# Patient Record
Sex: Male | Born: 1995 | Race: White | Hispanic: No | Marital: Single | Smoking: Never smoker
Health system: Southern US, Community
[De-identification: ages and names within clinical notes are randomized; demographics above are authoritative.]

---

## 2015-11-23 ENCOUNTER — Emergency Department
Admission: EM | Admit: 2015-11-23 | Discharge: 2015-11-23 | Disposition: A | Discharge status: Other Acute Inpatient Hospital | Payer: BLUE CROSS/BLUE SHIELD | Attending: Emergency Medicine | Admitting: Emergency Medicine

## 2015-11-23 ENCOUNTER — Encounter: Payer: Self-pay | Admitting: Emergency Medicine

## 2015-11-23 ENCOUNTER — Emergency Department: Payer: BLUE CROSS/BLUE SHIELD

## 2015-11-23 DIAGNOSIS — R079 Chest pain, unspecified: Secondary | ICD-10-CM | POA: Diagnosis present

## 2015-11-23 DIAGNOSIS — J982 Interstitial emphysema: Secondary | ICD-10-CM | POA: Insufficient documentation

## 2015-11-23 LAB — CBC
HCT: 46.1 % (ref 40.0–52.0)
HEMOGLOBIN: 15.7 g/dL (ref 13.0–18.0)
MCH: 30.1 pg (ref 26.0–34.0)
MCHC: 34.1 g/dL (ref 32.0–36.0)
MCV: 88.2 fL (ref 80.0–100.0)
Platelets: 245 10*3/uL (ref 150–440)
RBC: 5.23 MIL/uL (ref 4.40–5.90)
RDW: 14 % (ref 11.5–14.5)
WBC: 11 10*3/uL — ABNORMAL HIGH (ref 3.8–10.6)

## 2015-11-23 LAB — BASIC METABOLIC PANEL
Anion gap: 8 (ref 5–15)
BUN: 6 mg/dL (ref 6–20)
CALCIUM: 10.2 mg/dL (ref 8.9–10.3)
CO2: 26 mmol/L (ref 22–32)
CREATININE: 1.01 mg/dL (ref 0.61–1.24)
Chloride: 103 mmol/L (ref 101–111)
GFR calc Af Amer: >60 mL/min (ref >60)
GLUCOSE: 100 mg/dL — AB (ref 65–99)
Potassium: 3.2 mmol/L — ABNORMAL LOW (ref 3.5–5.1)
Sodium: 137 mmol/L (ref 135–145)

## 2015-11-23 LAB — SEDIMENTATION RATE: SED RATE: 2 mm/h (ref 0–15)

## 2015-11-23 LAB — TROPONIN I

## 2015-11-23 MED ORDER — SODIUM CHLORIDE 0.9 % IV BOLUS (SEPSIS)
1000.0000 mL | Freq: Once | INTRAVENOUS | Status: AC
  Administered 2015-11-23: 1000 mL via INTRAVENOUS

## 2015-11-23 MED ORDER — MORPHINE SULFATE (PF) 4 MG/ML IV SOLN
4.0000 mg | Freq: Once | INTRAVENOUS | Status: AC
  Administered 2015-11-23: 4 mg via INTRAVENOUS

## 2015-11-23 MED ORDER — COLCHICINE 0.6 MG PO TABS
0.6000 mg | ORAL_TABLET | Freq: Once | ORAL | Status: DC
  Filled 2015-11-23: qty 1

## 2015-11-23 MED ORDER — INDOMETHACIN 50 MG PO CAPS
50.0000 mg | ORAL_CAPSULE | Freq: Once | ORAL | Status: DC
  Filled 2015-11-23: qty 1

## 2015-11-23 MED ORDER — IOPAMIDOL (ISOVUE-370) INJECTION 76%
75.0000 mL | Freq: Once | INTRAVENOUS | Status: AC | PRN
  Administered 2015-11-23: 75 mL via INTRAVENOUS
  Filled 2015-11-23: qty 75

## 2015-11-23 MED ORDER — MORPHINE SULFATE (PF) 4 MG/ML IV SOLN
INTRAVENOUS | Status: AC
  Filled 2015-11-23: qty 1

## 2015-11-23 MED ORDER — OXYCODONE-ACETAMINOPHEN 5-325 MG PO TABS: 1.0000 | ORAL_TABLET | Freq: Once | ORAL | Status: DC

## 2015-11-23 NOTE — ED Notes (Signed)
MD at bedside. 

## 2015-11-23 NOTE — ED Notes (Signed)
Pt reports chest pain started this morning, reports he had been up studying all night. Pt reports left sided chest pain, denies radiation. Pt reports mild cough for a few days, reports shortness of breath and dizziness earlier today.

## 2015-11-23 NOTE — ED Notes (Signed)
Upon auscultation of pts heart audible rub noted.

## 2015-11-23 NOTE — ED Notes (Signed)
Patient transported to CT 

## 2015-11-23 NOTE — ED Notes (Signed)
Report given to [facility], RN Tammy SoursGreg at this time.

## 2015-11-23 NOTE — ED Notes (Signed)
Pharmacy called regarding meds, will send to ED momentarily.

## 2015-11-23 NOTE — ED Notes (Signed)
[facility] at Bedside for transport

## 2015-11-23 NOTE — ED Provider Notes (Addendum)
[facility] Emergency Department Provider Note  ____________________________________________   I have reviewed the triage vital signs and the nursing notes.   HISTORY  Chief Complaint Chest Pain    HPI Andre Villa is a 20 y.o. male presents today with chest pain. His around the left chest, "around my heart". He states it somewhat positional, essentially better when he lies back and worse when he leans forward, it is somewhat pleuritic. He is not otherwise short of breath but it does hurt to take a deep breath. He has had an occasional dry cough with no other illness. He is studying for finals. He states that he did take 2 Adderall yesterday evening to help him study. Does not use IV drugs. The pain began approximately 2:00 in the morning. He has had no fever or chills. Denies leg swelling recent travel or personal or family history of early cardiac pathology or blood clots. No recent surgery.  Pain is sharp and nonradiating.  History reviewed. No pertinent past medical history.  There are no active problems to display for this patient.   History reviewed. No pertinent past surgical history.  No current outpatient prescriptions on file.  Allergies Review of patient's allergies indicates no known allergies.  No family history on file.  Social History Social History  Substance Use Topics  . Smoking status: Never Smoker   . Smokeless tobacco: None  . Alcohol Use: No    Review of Systems Constitutional: No fever/chills Eyes: No visual changes. ENT: No sore throat. No stiff neck no neck pain Cardiovascular: Positive chest pain. Respiratory: Denies shortness of breath. Gastrointestinal:   no vomiting.  No diarrhea.  No constipation. Genitourinary: Negative for dysuria. Musculoskeletal: Negative lower extremity swelling Skin: Negative for rash. Neurological: Negative for headaches, focal weakness or numbness. 10-point ROS otherwise  negative.  ____________________________________________   PHYSICAL EXAM:  VITAL SIGNS: ED Triage Vitals  Enc Vitals Group     BP 11/23/15 1651 145/83 mmHg     Pulse Rate 11/23/15 1651 102     Resp 11/23/15 1651 18     Temp 11/23/15 1651 98.2 F (36.8 C)     Temp Source 11/23/15 1651 Oral     SpO2 11/23/15 1651 99 %     Weight 11/23/15 1651 192 lb (87.091 kg)     Height 11/23/15 1651 6\' 2"  (1.88 m)     Head Cir --      Peak Flow --      Pain Score 11/23/15 1652 8     Pain Loc --      Pain Edu? --      Excl. in GC? --     Constitutional: Alert and oriented. Well appearing and in no acute distress. Mildly anxious Eyes: Conjunctivae are normal. PERRL. EOMI. Head: Atraumatic. Nose: No congestion/rhinnorhea. Mouth/Throat: Mucous membranes are moist.  Oropharynx non-erythematous. Neck: No stridor.   Nontender with no meningismus Cardiovascular: Normal rate, occasional early beat noted, no murmur noted but there is a pericardial rub appreciated  Respiratory: Normal respiratory effort.  No retractions. Lungs CTAB. Abdominal: Soft and nontender. No distention. No guarding no rebound Back:  There is no focal tenderness or step off there is no midline tenderness there are no lesions noted. there is no CVA tenderness Musculoskeletal: No lower extremity tenderness. No joint effusions, no DVT signs strong distal pulses no edema Neurologic:  Normal speech and language. No gross focal neurologic deficits are appreciated.  Skin:  Skin is warm, dry  and intact. No rash noted. Psychiatric: Mood and affect are normal. Speech and behavior are normal.  ____________________________________________   LABS (all labs ordered are listed, but only abnormal results are displayed)  Labs Reviewed  BASIC METABOLIC PANEL - Abnormal; Notable for the following:    Potassium 3.2 (*)    Glucose, Bld 100 (*)    All other components within normal limits  CBC - Abnormal; Notable for the following:    WBC  11.0 (*)    All other components within normal limits  TROPONIN I  SEDIMENTATION RATE  URINE DRUG SCREEN, QUALITATIVE ([facility] ONLY)   ____________________________________________  EKG  I personally interpreted any EKGs ordered by me or triage Normal sinus rhythm rate 92 bpm, no acute ST elevation or depression, RSR prime configuration, rightward axis, no old for comparison. ____________________________________________  RADIOLOGY  I reviewed any imaging ordered by me or triage that were performed during my shift and, if possible, patient and/or family made aware of any abnormal findings. ____________________________________________   PROCEDURES  Procedure(s) performed: None  Critical Care performed: None  ____________________________________________   INITIAL IMPRESSION / ASSESSMENT AND PLAN / ED COURSE  Pertinent labs & imaging results that were available during my care of the patient were reviewed by me and considered in my medical decision making (see chart for details).  Patient with what appears to be a rub on auscultation with positional although better when he lays back and worse when he lays forward, chest pain which is pleuritic. No history of significant cough or significant URI symptoms. Really no risk factors for PE. I did discuss with Dr. Clide Cliffkline of cardiology who agreed with anti-inflammatories including colchicine for this but I did elect to obtain a CT scan to make sure that this was not something other than what appeared to be, which is pericarditis. Also wish to evaluate initially for the possibility of pericardial effusion although he has physiology. ct to my read shows a diffuse pneumomediastinum. Is unclear the etiology of this. We are waiting CT read. I have asked the patient he does not admit to cocaine use. He has not been vomiting. He remains very well-appearing at this time. Vital signs have been reassuring. Blood work is reassuring. We will continue to observe  him closely in the emergency department pending read of CT and the disposition will be based upon what we find.  ----------------------------------------- 8:05 PM on 11/23/2015 -----------------------------------------  Patient had no sneeze, no pain or trauma of any description, no vomiting, just gradually started having pleuritic chest pain which was worse with deep breath and position. Vital signs are reassuring, CT shows diffuse mediastinum air. We will [facility] and I have paged them for further input on the best disposition for this patient. Crepitus is palpated in the right neck at this time.  ----------------------------------------- 9:25 PM on 11/23/2015 -----------------------------------------  D.w. Dr. Oneida AlarHaithcock, CT surgeon at [facility], who feels that the patient should have a barium esophageal study to make sure there is no perforation of the esophagus and if that is negative can be observed for 23 hours and then go home without any intervention. I discussed then with our radiologist here and we cannot get that done. I am therefore [facility] to see if we can perhaps transfer the patient to where he can get a barium swallow study as well as available CT surgery backup.  ----------------------------------------- 10:17 PM on 11/23/2015 -----------------------------------------  Dr. Adela Glimpseoutova was kind enough  to take the call and accept the patient to [facility] for observation and further evaluation. Patient remained stable here.    FINAL CLINICAL IMPRESSION(S) / ED DIAGNOSES  Final diagnoses:  None      This chart was dictated using voice recognition software.  Despite best efforts to proofread,  errors can occur which can change meaning.     Jeanmarie Plant, MD 11/23/15 1902  Jeanmarie Plant, MD 11/23/15 1949  Jeanmarie Plant, MD 11/23/15 2007  Jeanmarie Plant, MD 11/23/15 2126  Jeanmarie Plant, MD 11/23/15 2217

## 2015-11-24 ENCOUNTER — Inpatient Hospital Stay (HOSPITAL_COMMUNITY): Payer: BLUE CROSS/BLUE SHIELD

## 2015-11-24 ENCOUNTER — Encounter (HOSPITAL_COMMUNITY): Payer: Self-pay | Admitting: *Deleted

## 2015-11-24 ENCOUNTER — Inpatient Hospital Stay (HOSPITAL_COMMUNITY)
Admission: AD | Admit: 2015-11-24 | Discharge: 2015-11-25 | DRG: 201 | Disposition: A | Discharge status: Home / Self Care | Payer: BLUE CROSS/BLUE SHIELD | Source: Other Acute Inpatient Hospital | Attending: Internal Medicine | Admitting: Internal Medicine

## 2015-11-24 DIAGNOSIS — Z8249 Family history of ischemic heart disease and other diseases of the circulatory system: Secondary | ICD-10-CM | POA: Diagnosis not present

## 2015-11-24 DIAGNOSIS — R0789 Other chest pain: Secondary | ICD-10-CM | POA: Diagnosis present

## 2015-11-24 DIAGNOSIS — J982 Interstitial emphysema: Secondary | ICD-10-CM | POA: Diagnosis present

## 2015-11-24 DIAGNOSIS — K229 Disease of esophagus, unspecified: Secondary | ICD-10-CM

## 2015-11-24 LAB — COMPREHENSIVE METABOLIC PANEL
ALT: 19 U/L (ref 17–63)
AST: 16 U/L (ref 15–41)
Albumin: 4.1 g/dL (ref 3.5–5.0)
Alkaline Phosphatase: 48 U/L (ref 38–126)
Anion gap: 9 (ref 5–15)
BILIRUBIN TOTAL: 1.8 mg/dL — AB (ref 0.3–1.2)
CO2: 28 mmol/L (ref 22–32)
CREATININE: 1.06 mg/dL (ref 0.61–1.24)
Calcium: 9.8 mg/dL (ref 8.9–10.3)
Chloride: 104 mmol/L (ref 101–111)
Glucose, Bld: 88 mg/dL (ref 65–99)
POTASSIUM: 4 mmol/L (ref 3.5–5.1)
Sodium: 141 mmol/L (ref 135–145)
TOTAL PROTEIN: 6.6 g/dL (ref 6.5–8.1)

## 2015-11-24 LAB — CBC WITH DIFFERENTIAL/PLATELET
BASOS ABS: 0 10*3/uL (ref 0.0–0.1)
Basophils Relative: 1 %
EOS ABS: 0.1 10*3/uL (ref 0.0–0.7)
EOS PCT: 2 %
HEMATOCRIT: 42.7 % (ref 39.0–52.0)
HEMOGLOBIN: 14.6 g/dL (ref 13.0–17.0)
LYMPHS PCT: 24 %
Lymphs Abs: 1.9 10*3/uL (ref 0.7–4.0)
MCH: 30.4 pg (ref 26.0–34.0)
MCHC: 34.2 g/dL (ref 30.0–36.0)
MCV: 88.8 fL (ref 78.0–100.0)
Monocytes Absolute: 0.8 10*3/uL (ref 0.1–1.0)
Monocytes Relative: 10 %
Neutro Abs: 5.1 10*3/uL (ref 1.7–7.7)
Neutrophils Relative %: 64 %
Platelets: 223 10*3/uL (ref 150–400)
RBC: 4.81 MIL/uL (ref 4.22–5.81)
RDW: 13.4 % (ref 11.5–15.5)
WBC: 8.1 10*3/uL (ref 4.0–10.5)

## 2015-11-24 LAB — GLUCOSE, CAPILLARY
GLUCOSE-CAPILLARY: 79 mg/dL (ref 65–99)
Glucose-Capillary: 72 mg/dL (ref 65–99)

## 2015-11-24 LAB — TROPONIN I: Troponin I: <0.03 ng/mL (ref <0.031)

## 2015-11-24 MED ORDER — DIATRIZOATE MEGLUMINE & SODIUM 66-10 % PO SOLN
ORAL | Status: AC
  Filled 2015-11-24: qty 120

## 2015-11-24 MED ORDER — PIPERACILLIN-TAZOBACTAM 3.375 G IVPB
3.3750 g | Freq: Three times a day (TID) | INTRAVENOUS | Status: DC
  Administered 2015-11-24: 3.375 g via INTRAVENOUS
  Filled 2015-11-24 (×3): qty 50

## 2015-11-24 MED ORDER — MORPHINE SULFATE (PF) 4 MG/ML IV SOLN
4.0000 mg | Freq: Once | INTRAVENOUS | Status: AC
  Administered 2015-11-24: 4 mg via INTRAVENOUS
  Filled 2015-11-24: qty 1

## 2015-11-24 MED ORDER — DIATRIZOATE MEGLUMINE & SODIUM 66-10 % PO SOLN
90.0000 mL | Freq: Once | ORAL | Status: AC
  Administered 2015-11-24: 190 mL via ORAL
  Filled 2015-11-24: qty 90

## 2015-11-24 MED ORDER — ACETAMINOPHEN 650 MG RE SUPP: 650.0000 mg | Freq: Four times a day (QID) | RECTAL | Status: DC | PRN

## 2015-11-24 MED ORDER — ONDANSETRON HCL 4 MG/2ML IJ SOLN: 4.0000 mg | Freq: Four times a day (QID) | INTRAMUSCULAR | Status: DC | PRN

## 2015-11-24 MED ORDER — MORPHINE SULFATE (PF) 2 MG/ML IV SOLN
2.0000 mg | INTRAVENOUS | Status: DC | PRN
  Administered 2015-11-25: 2 mg via INTRAVENOUS
  Filled 2015-11-24: qty 1

## 2015-11-24 MED ORDER — VANCOMYCIN HCL IN DEXTROSE 1-5 GM/200ML-% IV SOLN
1000.0000 mg | Freq: Three times a day (TID) | INTRAVENOUS | Status: DC
Start: 2015-11-24 — End: 2015-11-24
  Filled 2015-11-24 (×2): qty 200

## 2015-11-24 MED ORDER — MORPHINE SULFATE (PF) 2 MG/ML IV SOLN
1.0000 mg | INTRAVENOUS | Status: DC | PRN
  Administered 2015-11-24 (×2): 1 mg via INTRAVENOUS
  Filled 2015-11-24 (×2): qty 1

## 2015-11-24 MED ORDER — ONDANSETRON HCL 4 MG PO TABS: 4.0000 mg | ORAL_TABLET | Freq: Four times a day (QID) | ORAL | Status: DC | PRN

## 2015-11-24 MED ORDER — ACETAMINOPHEN 325 MG PO TABS: 650.0000 mg | ORAL_TABLET | Freq: Four times a day (QID) | ORAL | Status: DC | PRN

## 2015-11-24 MED ORDER — VANCOMYCIN HCL IN DEXTROSE 1-5 GM/200ML-% IV SOLN
1000.0000 mg | Freq: Three times a day (TID) | INTRAVENOUS | Status: DC
  Administered 2015-11-24: 1000 mg via INTRAVENOUS
  Filled 2015-11-24 (×2): qty 200

## 2015-11-24 MED ORDER — SODIUM CHLORIDE 0.9 % IV SOLN
INTRAVENOUS | Status: DC
  Administered 2015-11-24: 06:00:00 via INTRAVENOUS

## 2015-11-24 MED FILL — Fentanyl Citrate Preservative Free (PF) Inj 100 MCG/2ML: INTRAMUSCULAR | Qty: 2 | Status: AC

## 2015-11-24 NOTE — Progress Notes (Signed)
During bedside report at shift change, patient c/o generalized itching. Patient's face and neck were red. IV infusions were paused and pharmacy was called; per pharmacy, patient most likely experiencing a reaction to vancomycin and further doses will be infused at a slower rate. Vancomycin dose was nearly completed and was disconnected from patient. Patient reported that his symptoms were beginning to resolve. Patient will notify his RN if his symptoms change or persist.

## 2015-11-24 NOTE — Progress Notes (Signed)
Pharmacy Antibiotic Note  Andre Villa is a 20 y.o. male admitted on 11/24/2015 with Pneumomediastinum.  Pharmacy has been consulted for Vancomycin/Zosyn dosing. [facility] is [facility]. WBC ok, renal function good.   Plan: -Vancomycin 1000 mg IV q8h -Zosyn 3.375G IV q8h to be infused over 4 hours -Trend WBC, temp, renal function  -Drug levels as indicated   Height: 6\' 2"  (188 cm) Weight: 174 lb 12.8 oz (79.289 kg) IBW/kg (Calculated) : 82.2  Temp (24hrs), Avg:98.3 F (36.8 C), Min:98.2 F (36.8 C), Max:98.3 F (36.8 C)   Recent Labs Lab 11/23/15 1654  WBC 11.0*  CREATININE 1.01    Estimated Creatinine Clearance: 130.9 mL/min (by C-G formula based on Cr of 1.01).    No Known Allergies   Andre Villa, Andre Villa 11/24/2015 3:42 AM

## 2015-11-24 NOTE — Progress Notes (Signed)
Patient ID: Andre Villa, male   DOB: 05/17/1996, 20 y.o.   MRN: [id number]  PROGRESS NOTE    Andre Villa  [id number] DOB: 10/26/1995 DOA: 11/24/2015  PCP: No PCP Per Patient   Brief Narrative:  89 -year-old male with no significant past medical history who presented to [facility] with worsening chest pain for past 2 days prior to this admission, worse with deep inspiration. Patient had CT angiogram of the chest which revealed pneumomediastinum with tiny pneumothorax and patient was transferred [facility] for [facility] evaluation. Cardiothoracic surgery will see the patient in consultation.  Assessment & Plan:   Chest pain, pleuritic - Likely related to pneumomediastinum - The troponin level negative 2 - CT angiogram of the chest show no pulmonary embolism - Chest pain better this morning  Pneumomediastinum (HCC) - Appreciate cardiothoracic surgery recommendations, spoke with Dr. Cornelius Moraswen over the phone who believes no acute surgical intervention required at this time - Barium swallow will be done today and if normal slightly patient can be discharged provided okay with cardiothoracic surgery   DVT prophylaxis: SCD's bilaterally  Code Status: full code  Family Communication: no family at the bedside this am  Disposition Plan: home is barium swallow normal and if okay per CTS   Consultants:   CTS   Procedures:   None   Antimicrobials:   Vanco and zosyn 5/16    Subjective: Says he feels okay this am.   Objective: Filed Vitals:   11/24/15 0032 11/24/15 0100 11/24/15 0457  BP: 132/76  135/64  Pulse: 77  76  Temp: 98.3 F (36.8 C)  98.5 F (36.9 C)  TempSrc: Oral  Oral  Resp: 18  18  Height:  6\' 2"  (1.88 m)   Weight:  79.289 kg (174 lb 12.8 oz)   SpO2: 100%  100%    Intake/Output Summary (Last 24 hours) at 11/24/15 0859 Last data filed at 11/24/15 0600  Gross per 24 hour  Intake    250 ml  Output      0 ml  Net    250 ml    Filed Weights   11/24/15 0100  Weight: 79.289 kg (174 lb 12.8 oz)    Examination:  General exam: Appears calm and comfortable  Respiratory system: Respiratory effort normal. No wheezing  Cardiovascular system: S1 & S2 heard, RRR. No JVD, murmurs, rubs, gallops or clicks. No pedal edema. Gastrointestinal system: Abdomen is nondistended, soft and nontender. No organomegaly or masses felt. Normal bowel sounds heard. Central nervous system: Alert and oriented. No focal neurological deficits. Extremities: Symmetric 5 x 5 power. Skin: No rashes, lesions or ulcers Psychiatry: Judgement and insight appear normal. Mood & affect appropriate.   Data Reviewed: I have personally reviewed following labs and imaging studies  CBC:  Recent Labs Lab 11/23/15 1654 11/24/15 0350  WBC 11.0* 8.1  NEUTROABS  --  5.1  HGB 15.7 14.6  HCT 46.1 42.7  MCV 88.2 88.8  PLT 245 223   Basic Metabolic Panel:  Recent Labs Lab 11/23/15 1654 11/24/15 0350  NA 137 141  K 3.2* 4.0  CL 103 104  CO2 26 28  GLUCOSE 100* 88  BUN 6 <5*  CREATININE 1.01 1.06  CALCIUM 10.2 9.8   GFR: Estimated Creatinine Clearance: 124.7 mL/min (by C-G formula based on Cr of 1.06). Liver Function Tests:  Recent Labs Lab 11/24/15 0350  AST 16  ALT 19  ALKPHOS 48  BILITOT 1.8*  PROT  6.6  ALBUMIN 4.1   No results for input(s): LIPASE, AMYLASE in the last 168 hours. No results for input(s): AMMONIA in the last 168 hours. Coagulation Profile: No results for input(s): INR, PROTIME in the last 168 hours. Cardiac Enzymes:  Recent Labs Lab 11/23/15 1654 11/24/15 0350  TROPONINI <0.03 <0.03   BNP (last 3 results) No results for input(s): PROBNP in the last 8760 hours. HbA1C: No results for input(s): HGBA1C in the last 72 hours. CBG: No results for input(s): GLUCAP in the last 168 hours. Lipid Profile: No results for input(s): CHOL, HDL, LDLCALC, TRIG, CHOLHDL, LDLDIRECT in the last 72 hours. Thyroid  Function Tests: No results for input(s): TSH, T4TOTAL, FREET4, T3FREE, THYROIDAB in the last 72 hours. Anemia Panel: No results for input(s): VITAMINB12, FOLATE, FERRITIN, TIBC, IRON, RETICCTPCT in the last 72 hours. Urine analysis: No results found for: COLORURINE, APPEARANCEUR, LABSPEC, PHURINE, GLUCOSEU, HGBUR, BILIRUBINUR, KETONESUR, PROTEINUR, UROBILINOGEN, NITRITE, LEUKOCYTESUR Sepsis Labs: (procalcitonin:4,lacticidven:4)   )No results found for this or any previous visit (from the past 240 hour(s)).    Radiology Studies: Dg Chest 2 View 11/23/2015   No active cardiopulmonary disease. Electronically Signed   By: Ted Mcalpine M.D.   On: 11/23/2015 17:28   Ct Angio Chest Pe W/cm &/or Wo Cm 11/23/2015  No evidence of pulmonary embolism. Extensive pneumomediastinum as above without definite pneumothorax. Findings called to Dr. Alphonzo Lemmings on 11/23/2015 at 1953 hours. Electronically Signed   By: Ulyses Southward M.D.   On: 11/23/2015 19:56     Scheduled Meds: . piperacillin-tazobactam (ZOSYN)  IV  3.375 g Intravenous Q8H  . vancomycin  1,000 mg Intravenous Q8H   Continuous Infusions: . sodium chloride 100 mL/hr at 11/24/15 0600     LOS: 0 days    Time spent: 25 minutes  Greater than 50% of the time spent on counseling and coordinating the care.   Manson Passey, MD [facility] Pager [phone]  If 7PM-7AM, please contact night-coverage [email or web address] Password [email or web address] 11/24/2015, 8:59 AM

## 2015-11-24 NOTE — Consult Note (Signed)
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 Pain is described as sharp pain radiating across the chest that is exacerbated by coughing, deep breaths, and swallowing. The patient has developed mild hoarseness of voice. He specifically denies shortness of breath. He denies any recent history of trauma of any kind. He denies any recent history of nausea or vomiting. He denies any recent history of severe coughing, although he does state that he has had an intermittent dry nonproductive cough that has waxed and waned for several months. He denies any productive cough or hemoptysis.  He thought perhaps that he has had bronchitis once or twice during this school year and it never completely resolved. However, despite this he has not been coughing much during the last few weeks. He denies any history of binge drinking. He has not had any problems swallowing food prior to the development of chest pain 2 days ago.  He has not swallowed any large objects or partially chewed food that causes discomfort.  He has not been playing any sports recently as he has been spending his time studying for final exams.  The patient is a nonsmoker.  The remainder of his review of systems is noncontributory.    History reviewed. No pertinent past medical history.  History reviewed. No pertinent past surgical history.  Family History  Problem Relation Age of Onset  . CAD Mother     Social History   Social History  . Marital Status: Single    Spouse Name: N/A  . Number of Children: N/A  . Years of Education: N/A   Occupational History   . Not on file.   Social History Main Topics  . Smoking status: Never Smoker   . Smokeless tobacco: Not on file  . Alcohol Use: Yes     Comment: "On weekends"  . Drug Use: No  . Sexual Activity: Not on file   Other Topics Concern  . Not on file   Social History Narrative    Prior to Admission medications   Not on File    Current Facility-Administered Medications  Medication Dose Route Frequency Provider Last Rate Last Dose  . 0.9 %  sodium chloride infusion   Intravenous Continuous Eduard ClosArshad N Kakrakandy, MD 100 mL/hr at 11/24/15 0600    . acetaminophen (TYLENOL) tablet 650 mg  650 mg Oral Q6H PRN Eduard ClosArshad N Kakrakandy, MD       Or  . acetaminophen (TYLENOL) suppository 650 mg  650 mg Rectal Q6H PRN Eduard ClosArshad N Kakrakandy, MD      . morphine 2 MG/ML injection 1 mg  1 mg Intravenous Q3H PRN Eduard ClosArshad N Kakrakandy, MD   1 mg at 11/24/15 0559  . ondansetron (ZOFRAN) tablet 4 mg  4 mg Oral Q6H PRN Eduard ClosArshad N Kakrakandy, MD       Or  . ondansetron [facility](ZOFRAN) injection 4 mg  4 mg Intravenous Q6H PRN Eduard ClosArshad N Kakrakandy, MD        No Known Allergies    Review of  Systems:   General:  normal appetite, normal energy, no weight gain, no weight loss, no fever  Cardiac:  no chest pain with exertion, + chest pain at rest, noSOB with no exertion, no resting SOB, no PND, no orthopnea, no palpitations, no arrhythmia, no atrial fibrillation, no LE edema, no dizzy spells, no syncope  Respiratory:  no shortness of breath, no home oxygen, no productive cough, + dry cough, no bronchitis, no wheezing, no hemoptysis, no asthma, + pain with inspiration or cough, no sleep apnea, no CPAP at night  GI:   + difficulty swallowing, no reflux, no frequent heartburn, no hiatal hernia, no abdominal pain, no constipation, no diarrhea, no hematochezia, no hematemesis, no melena  GU:   no dysuria,  no frequency, no urinary tract infection, no hematuria, no enlarged prostate, no kidney stones, no kidney  disease  Vascular:  no pain suggestive of claudication, no pain in feet, no leg cramps, no varicose veins, no DVT, no non-healing foot ulcer  Neuro:   no stroke, no TIA's, no seizures, no headaches, no temporary blindness one eye,  no slurred speech, no peripheral neuropathy, no chronic pain, no instability of gait, no memory/cognitive dysfunction  Musculoskeletal: no arthritis, no joint swelling, no myalgias, no difficulty walking, normal mobility   Skin:   no rash, no itching, no skin infections, no pressure sores or ulcerations  Psych:   no anxiety, no depression, no nervousness, no unusual recent stress  Eyes:   no blurry vision, no floaters, no recent vision changes, + wears glasses or contacts  ENT:   no hearing loss, no loose or painful teeth, no dentures, last saw dentist within the past year  Hematologic:  no easy bruising, no abnormal bleeding, no clotting disorder, no frequent epistaxis  Endocrine:  no diabetes, does not check CBG's at home     Physical Exam:   BP 135/64 mmHg  Pulse 76  Temp(Src) 98.5 F (36.9 C) (Oral)  Resp 18  Ht 6\' 2"  (1.88 m)  Wt 174 lb 12.8 oz (79.289 kg)  BMI 22.43 kg/m2  SpO2 100%  General:  Thin,  well-appearing  HEENT:  Unremarkable   Neck:   no JVD, no bruits, no adenopathy, + crepitus  Chest:   clear to auscultation, symmetrical breath sounds, no wheezes, no rhonchi   CV:   RRR, no  murmur   Abdomen:  soft, non-tender, no masses   Extremities:  warm, well-perfused, pulses palpable, no lower extremity edema  Rectal/GU  Deferred  Neuro:   Grossly non-focal and symmetrical throughout  Skin:   Clean and dry, no rashes, no breakdown  Diagnostic Tests:  CHEST 2 VIEW  COMPARISON: None.  FINDINGS: The heart size and mediastinal contours are within normal limits. Both lungs are clear. The visualized skeletal structures are unremarkable.  IMPRESSION: No active cardiopulmonary disease.   Electronically Signed  By: Ted Mcalpine M.D.  On: 11/23/2015 17:28   CT ANGIOGRAPHY CHEST WITH CONTRAST  TECHNIQUE: Multidetector CT imaging of the chest was performed using the standard protocol during bolus administration of intravenous contrast. Multiplanar CT image reconstructions and MIPs were obtained to evaluate the vascular anatomy.  CONTRAST: 75 cc Isovue 370 IV  COMPARISON: None ; correlation chest radiographs 11/23/2015  FINDINGS: Aorta normal caliber without aneurysm or dissection.  Pulmonary arteries well opacified and patent.  No evidence of pulmonary embolism.  Visualized upper abdomen grossly unremarkable.  No thoracic adenopathy.  Extensive pneumomediastinum extending into inferior cervical region particularly on RIGHT.  Air dissects along the pulmonary arteries into pulmonary hila and into central portions of both lungs.  Questionable tiny pneumothorax at the anterior RIGHT lung base versus extrapleural air, favor extrapleural on sagittal images.  Lungs otherwise clear.  No pulmonary infiltrate, pleural effusion, or mass/nodule.  Osseous structures unremarkable.  Review of the MIP images confirms the above findings.  IMPRESSION: No evidence of pulmonary embolism.  Extensive pneumomediastinum as above without definite pneumothorax.  Findings called to Dr. Alphonzo Lemmings on 11/23/2015 at 1953 hours.   Electronically Signed  By: Ulyses Southward M.D.  On: 11/23/2015 19:56    Impression:  Patient presents with primary spontaneous pneumomediastinum. There is no recent history of trauma, vomiting, difficulty swallowing, severe coughing, or other potentially corroborating circumstances.  I have personally reviewed the patient's CT scan which reveals the presence of diffuse pneumomediastinum with air tracking into the neck. There is no fluid in the mediastinum nor other signs of esophageal perforation. There is no pneumothorax. There are no other signs of recent  trauma.    Plan:  I agree with plans to proceed with water-soluble contrast esophagogram to rule out esophageal perforation. This seems unlikely. If the esophagogram is not diagnostic repeat CT scan with water-soluble contrast and/or thin barium could be considered. In the absence of esophageal perforation the patient's treatment remains expectant. He should remain nothing by mouth for now. I discussed the nature of this disease process with the patient and over the telephone with his mother. All their questions have been addressed. We'll continue to follow closely.   I spent in excess of 60 minutes during the conduct of this hospital consultation and >50% of this time involved direct face-to-face encounter for counseling and/or coordination of the patient's care.    Salvatore Decent. Cornelius Moras, MD 11/24/2015 9:37 AM

## 2015-11-24 NOTE — H&P (Signed)
History and Physical    Andre Cullensan F Thompson RUE:[id number]:[id number] DOB: 10/18/1995 DOA: 11/24/2015  PCP: No PCP Per Patient  Patient coming from: Home.  Chief Complaint: Chest pain.  HPI: Andre Villa is a 20 y.o. male with no significant past medical history had presented to the ER at [facility] with complaints of chest pain. Patient has been having chest pain since last 2 days. Pain is centered retrosternal increases on deep inspiration. Denies any fever or chills. Denies any recent vomiting. CT on November the chest done in the ER shows pneumomediastinum and patient was [facility] for further workup including Gastrografin swallow. On exam patient is not in distress and is afebrile. He has a physician had discussed [facility] with advise to make sure there is no esophageal leak with the swallow test.  ED Course: Patient really medications.  Review of Systems: As per HPI otherwise 10 point review of systems negative.    History reviewed. No pertinent past medical history.  History reviewed. No pertinent past surgical history.   reports that he has never smoked. He does not have any smokeless tobacco history on file. He reports that he drinks alcohol. He reports that he does not use illicit drugs.  No Known Allergies  Family History  Problem Relation Age of Onset  . CAD Mother     Prior to Admission medications   Not on File    Physical Exam: Filed Vitals:   11/24/15 0032 11/24/15 0100  BP: 132/76   Pulse: 77   Temp: 98.3 F (36.8 C)   TempSrc: Oral   Resp: 18   Height:  6\' 2"  (1.88 m)  Weight:  174 lb 12.8 oz (79.289 kg)  SpO2: 100%       Constitutional: Not in distress. Filed Vitals:   11/24/15 0032 11/24/15 0100  BP: 132/76   Pulse: 77   Temp: 98.3 F (36.8 C)   TempSrc: Oral   Resp: 18   Height:  6\' 2"  (1.88 m)  Weight:  174 lb 12.8 oz (79.289 kg)  SpO2: 100%    Eyes: Anicteric no pallor. ENMT: No discharge  from the ears eyes nose and mouth. Neck: No mass felt. No JVD appreciated. Respiratory: No rhonchi or crepitations. Cardiovascular: S1-S2 heard. Abdomen: Soft nontender bowel sounds present. Musculoskeletal: No edema. Skin: No rash. Neurologic: Alert awake oriented to time place and person. Moves all activities. Psychiatric: Appears normal.   Labs on Admission: I have personally reviewed following labs and imaging studies  CBC:  Recent Labs Lab 11/23/15 1654  WBC 11.0*  HGB 15.7  HCT 46.1  MCV 88.2  PLT 245   Basic Metabolic Panel:  Recent Labs Lab 11/23/15 1654  NA 137  K 3.2*  CL 103  CO2 26  GLUCOSE 100*  BUN 6  CREATININE 1.01  CALCIUM 10.2   GFR: Estimated Creatinine Clearance: 130.9 mL/min (by C-G formula based on Cr of 1.01). Liver Function Tests: No results for input(s): AST, ALT, ALKPHOS, BILITOT, PROT, ALBUMIN in the last 168 hours. No results for input(s): LIPASE, AMYLASE in the last 168 hours. No results for input(s): AMMONIA in the last 168 hours. Coagulation Profile: No results for input(s): INR, PROTIME in the last 168 hours. Cardiac Enzymes:  Recent Labs Lab 11/23/15 1654  TROPONINI <0.03   BNP (last 3 results) No results for input(s): PROBNP in the last 8760 hours. HbA1C: No results for input(s): HGBA1C in the  last 72 hours. CBG: No results for input(s): GLUCAP in the last 168 hours. Lipid Profile: No results for input(s): CHOL, HDL, LDLCALC, TRIG, CHOLHDL, LDLDIRECT in the last 72 hours. Thyroid Function Tests: No results for input(s): TSH, T4TOTAL, FREET4, T3FREE, THYROIDAB in the last 72 hours. Anemia Panel: No results for input(s): VITAMINB12, FOLATE, FERRITIN, TIBC, IRON, RETICCTPCT in the last 72 hours. Urine analysis: No results found for: COLORURINE, APPEARANCEUR, LABSPEC, PHURINE, GLUCOSEU, HGBUR, BILIRUBINUR, KETONESUR, PROTEINUR, UROBILINOGEN, NITRITE, LEUKOCYTESUR Sepsis  Labs: (procalcitonin:4,lacticidven:4) )No results found for this or any previous visit (from the past 240 hour(s)).   Radiological Exams on Admission: Dg Chest 2 View  11/23/2015  CLINICAL DATA:  Left-sided chest pain. EXAM: CHEST  2 VIEW COMPARISON:  None. FINDINGS: The heart size and mediastinal contours are within normal limits. Both lungs are clear. The visualized skeletal structures are unremarkable. IMPRESSION: No active cardiopulmonary disease. Electronically Signed   By: Ted Mcalpine M.D.   On: 11/23/2015 17:28   Ct Angio Chest Pe W/cm &/or Wo Cm  11/23/2015  CLINICAL DATA:  Chest pain, somewhat positional better when he lights back, worse when leaning forward, somewhat pleuritic, hurts to take a deep breath, occasional dry cough, no significant past history EXAM: CT ANGIOGRAPHY CHEST WITH CONTRAST TECHNIQUE: Multidetector CT imaging of the chest was performed using the standard protocol during bolus administration of intravenous contrast. Multiplanar CT image reconstructions and MIPs were obtained to evaluate the vascular anatomy. CONTRAST:  75 cc Isovue 370 IV COMPARISON:  None ; correlation chest radiographs 11/23/2015 FINDINGS: Aorta normal caliber without aneurysm or dissection. Pulmonary arteries well opacified and patent. No evidence of pulmonary embolism. Visualized upper abdomen grossly unremarkable. No thoracic adenopathy. Extensive pneumomediastinum extending into inferior cervical region particularly on RIGHT. Air dissects along the pulmonary arteries into pulmonary hila and into central portions of both lungs. Questionable tiny pneumothorax at the anterior RIGHT lung base versus extrapleural air, favor extrapleural on sagittal images. Lungs otherwise clear. No pulmonary infiltrate, pleural effusion, or mass/nodule. Osseous structures unremarkable. Review of the MIP images confirms the above findings. IMPRESSION: No evidence of pulmonary embolism. Extensive  pneumomediastinum as above without definite pneumothorax. Findings called to Dr. Alphonzo Lemmings on 11/23/2015 at 1953 hours. Electronically Signed   By: Ulyses Southward M.D.   On: 11/23/2015 19:56    EKG: Independently reviewed. Normal sinus rhythm with nonspecific ST changes. Sinus arrhythmia.  Assessment/Plan Principal Problem:   Pneumomediastinum (HCC)    #1. Pneumomediastinum, spontaneous - as per the ER physician and discuss with cardiology thoracic surgeon at [facility] had requested swallow evaluation to rule out adhesive vigil tear. I have discussed with on-call radiologist who at this time is advised to get water-based swallow test for esophagogram and a wide delirium. For now patient will be kept nothing by mouth and I have added empiric antibiotics vancomycin and Zosyn for now. Repeat chest x-ray will be added in a.m. and patient will be kept on pain relief medications. May discuss with cardiothoracic surgeon in a.m. for further recommendations. I did discuss with on-call pulmonologist.   DVT prophylaxis: SCDs. Code Status: Full code.  Family Communication: No family at the bedside.  Disposition Plan: Home.  Consults called: Did discuss with radiologist and on-call pulmonologist. Please consult on-call cardiothoracic surgeon in a.m.  Admission status: Inpatient. Telemetry. Likely stay 2-3 days.    Eduard Clos MD [facility] Pager [phone].  If 7PM-7AM, please contact night-coverage [email or web address] Password [facility]  11/24/2015, 3:37 AM

## 2015-11-25 ENCOUNTER — Inpatient Hospital Stay (HOSPITAL_COMMUNITY): Payer: BLUE CROSS/BLUE SHIELD

## 2015-11-25 DIAGNOSIS — J982 Interstitial emphysema: Secondary | ICD-10-CM

## 2015-11-25 LAB — RAPID URINE DRUG SCREEN, HOSP PERFORMED
AMPHETAMINES: POSITIVE — AB
BARBITURATES: NOT DETECTED
BENZODIAZEPINES: POSITIVE — AB
Cocaine: NOT DETECTED
Opiates: POSITIVE — AB
Tetrahydrocannabinol: POSITIVE — AB

## 2015-11-25 LAB — GLUCOSE, CAPILLARY
GLUCOSE-CAPILLARY: 82 mg/dL (ref 65–99)
Glucose-Capillary: 81 mg/dL (ref 65–99)
Glucose-Capillary: 94 mg/dL (ref 65–99)

## 2015-11-25 MED ORDER — HYDROCODONE-ACETAMINOPHEN 5-300 MG PO TABS: 1.0000 | ORAL_TABLET | ORAL | Status: AC | PRN

## 2015-11-25 MED ORDER — MORPHINE SULFATE (CONCENTRATE) 10 MG/0.5ML PO SOLN
5.0000 mg | ORAL | Status: DC | PRN
  Administered 2015-11-25: 5 mg via ORAL
  Filled 2015-11-25: qty 0.5

## 2015-11-25 NOTE — Progress Notes (Addendum)
[address]       [place] [id number]             [phone]          Subjective: Minor discomfort  Objective: Vital signs in last 24 hours: Temp:  [97.6 F (36.4 C)-97.7 F (36.5 C)] 97.6 F (36.4 C) (05/17 0500) Pulse Rate:  [67] 67 (05/16 2119) Cardiac Rhythm:  [-] Normal sinus rhythm (05/17 0855) Resp:  [18] 18 (05/17 0500) BP: (109-116)/(50-57) 109/50 mmHg (05/17 0500) SpO2:  [98 %-100 %] 100 % (05/17 0500) Weight:  [174 lb 8 oz (79.153 kg)] 174 lb 8 oz (79.153 kg) (05/17 0500)  Hemodynamic parameters for last 24 hours:    Intake/Output from previous day: 05/16 0701 - 05/17 0700 In: 360 [P.O.:360] Out: -  Intake/Output this shift:    General appearance: alert, cooperative and no distress Heart: regular rate and rhythm Lungs: clear to auscultation bilaterally Abdomen: benign exam  Lab Results:  Recent Labs  11/23/15 1654 11/24/15 0350  WBC 11.0* 8.1  HGB 15.7 14.6  HCT 46.1 42.7  PLT 245 223   BMET:  Recent Labs  11/23/15 1654 11/24/15 0350  NA 137 141  K 3.2* 4.0  CL 103 104  CO2 26 28  GLUCOSE 100* 88  BUN 6 <5*  CREATININE 1.01 1.06  CALCIUM 10.2 9.8    PT/INR: No results for input(s): LABPROT, INR in the last 72 hours. ABG No results found for: PHART, HCO3, TCO2, ACIDBASEDEF, O2SAT CBG (last 3)   Recent Labs  11/24/15 2117 11/25/15 0736 11/25/15 1132  GLUCAP 79 81 82    Meds Scheduled Meds:  Continuous Infusions:  PRN Meds:.acetaminophen **OR** acetaminophen, morphine CONCENTRATE, ondansetron **OR** ondansetron (ZOFRAN) IV  Xrays Dg Chest 2 View  11/25/2015  CLINICAL DATA:  Pneumomediastinum EXAM: CHEST  2 VIEW COMPARISON:  11/23/2015 FINDINGS: Stable pneumomediastinum. Stable subcutaneous emphysema in the supraclavicular regions. No pneumothorax. Normal heart size. No consolidation. IMPRESSION: Stable pneumomediastinum without pneumothorax. Electronically Signed   By: Jolaine ClickArthur  Hoss M.D.   On:  11/25/2015 08:12   Dg Chest 2 View  11/24/2015  CLINICAL DATA:  Upper to mid chest pain. EXAM: CHEST  2 VIEW COMPARISON:  11/23/2015 FINDINGS: Normal heart size. Pneumomediastinum is again identified. Subcutaneous gas is identified within the soft tissues overlying the right lower supraclavicular region. The scratch set there is no pleural effusion or edema. No pneumothorax identified. IMPRESSION: 1. No change in subcutaneous gas and pneumomediastinum. Electronically Signed   By: Signa Kellaylor  Stroud M.D.   On: 11/24/2015 13:51   Dg Chest 2 View  11/23/2015  CLINICAL DATA:  Left-sided chest pain. EXAM: CHEST  2 VIEW COMPARISON:  None. FINDINGS: The heart size and mediastinal contours are within normal limits. Both lungs are clear. The visualized skeletal structures are unremarkable. IMPRESSION: No active cardiopulmonary disease. Electronically Signed   By: Ted Mcalpineobrinka  Dimitrova M.D.   On: 11/23/2015 17:28   Ct Angio Chest Pe W/cm &/or Wo Cm  11/23/2015  CLINICAL DATA:  Chest pain, somewhat positional better when he lights back, worse when leaning forward, somewhat pleuritic, hurts to take a deep breath, occasional dry cough, no significant past history EXAM: CT ANGIOGRAPHY CHEST WITH CONTRAST TECHNIQUE: Multidetector CT imaging of the chest was performed using the standard protocol during bolus administration of intravenous contrast. Multiplanar CT image reconstructions and MIPs were obtained to evaluate the vascular anatomy. CONTRAST:  75 cc Isovue 370 IV COMPARISON:  None ;  correlation chest radiographs 11/23/2015 FINDINGS: Aorta normal caliber without aneurysm or dissection. Pulmonary arteries well opacified and patent. No evidence of pulmonary embolism. Visualized upper abdomen grossly unremarkable. No thoracic adenopathy. Extensive pneumomediastinum extending into inferior cervical region particularly on RIGHT. Air dissects along the pulmonary arteries into pulmonary hila and into central portions of both  lungs. Questionable tiny pneumothorax at the anterior RIGHT lung base versus extrapleural air, favor extrapleural on sagittal images. Lungs otherwise clear. No pulmonary infiltrate, pleural effusion, or mass/nodule. Osseous structures unremarkable. Review of the MIP images confirms the above findings. IMPRESSION: No evidence of pulmonary embolism. Extensive pneumomediastinum as above without definite pneumothorax. Findings called to Dr. Alphonzo Lemmings on 11/23/2015 at 1953 hours. Electronically Signed   By: Ulyses Southward M.D.   On: 11/23/2015 19:56   Dg Esophagus W/water Sol Cm  11/24/2015  CLINICAL DATA:  20 year old male with history of chest pain and spontaneous pneumomediastinum. EXAM: ESOPHOGRAM/BARIUM SWALLOW TECHNIQUE: Single contrast examination was performed using  water soluble. FLUOROSCOPY TIME:  Fluoroscopy Time:  1 minutes and 36 seconds Number of Acquired Images: Multiple series each with multiple images. COMPARISON:  None. FINDINGS: The patient was repeatedly image in the upright, supine and prone positions while swallowing Gastrografin, and at no point during the examination was extravasation of contrast from the esophageal lumen observed. The esophagus with structurally normal in appearance. IMPRESSION: 1. No evidence of esophageal perforation. Electronically Signed   By: Trudie Reed M.D.   On: 11/24/2015 12:55    Assessment/Plan:  1 clinically stable 2 esophagram is unremarkable 3 cont expectant management  LOS: 1 day    GOLD,WAYNE E 11/25/2015  I have seen and examined the patient and agree with the assessment and plan as outlined.  Advance diet and ambulate.  Okay for d/c home later today or tomorrow if he's tolerating soft diet and ambulating w/out increased pain, SOB, or dizziness.  Patient and his mother have been given instructions for d/c.  Specifically the patient is not to do any lifting, straining, strenuous exertion, or driving a car for at least 2 weeks.  I recommend f/u  CXR in our office on Monday 5/22.  If the patient is doing well at that time it would probably be reasonable for the patient and his mother to return home via automobile.  The patient should not travel via airlines.  I spent in excess of 15 minutes during the conduct of this hospital encounter and >50% of this time involved direct face-to-face encounter with the patient for counseling and/or coordination of their care.   Purcell Nails, MD 11/25/2015 2:36 PM

## 2015-11-25 NOTE — Discharge Instructions (Signed)
Esophageal Function Studies  Esophageal function studies are tests that measure how well the esophagus is working. The esophagus is a muscular tube that connects the mouth to the stomach. When the esophagus is working properly, it moves food and liquid smoothly into the stomach and keeps it from coming back up into the mouth. It also keeps stomach juices from flowing out of the stomach into the esophagus (reflux).  Esophageal function studies are often ordered when a person has symptoms that suggest that the esophagus is not working properly, such as:  · Trouble swallowing (dysphagia).  · Heartburn  · Chest pain with reflux.  · Choking.  · A sore throat.  · Coughing.  LET YOUR HEALTH CARE PROVIDER KNOW ABOUT:  · Any allergies you have.  · All medicines you are taking, including vitamins, herbs, eye drops, creams, and over-the-counter medicines.  · Previous problems you or members of your family have had with the use of anesthetics.  · Any blood disorders you have.  · Previous surgeries you have had.  · Medical conditions you have.  · The possibility of pregnancy.  RISKS AND COMPLICATIONS  Generally, this is a safe procedure. However, problems may occur, including:  · Sore throat.  · Nosebleed.  · Pneumonia from esophageal fluid that gets into your windpipe and lungs (aspiration pneumonia).  · A hole (perforation) in the esophagus. (This is rare.)  BEFORE THE PROCEDURE  · Ask your health care provider about:    Changing or stopping your regular medicines. This is especially important if you are taking diabetes medicines or blood thinners.    Taking medicines such as aspirin and ibuprofen. These medicines can thin your blood. Do not take these medicines before your procedure if your health care provider instructs you not to.  · Follow your health care provider's instructions about eating or drinking restrictions.  PROCEDURE  · A numbing medicine (local anesthetic) will be applied or sprayed inside your nose and down  your throat.  · A long, thin, pressure-sensing tube will be put through your nose and down into your esophagus. You will be asked to swallow as the tube goes in.  · Pressure measurements will be taken in different parts of your esophagus.  · You will be given some fluid to swallow and be asked to change your position a few times during the procedure.  · Another small tube will be passed through your nose into your esophagus to measure pH.  · An acid solution will be put into the tube to see if it causes you to feel discomfort.  · Acid samples will be taken.  · The acid level and the length of time that it takes to clear the acid from your esophagus will be measured.  The procedure may vary among health care providers and hospitals.  AFTER THE PROCEDURE  · Keep all follow-up visits as directed by your health care provider. This is important.  · Let your health care provider know if you have:    Bleeding from your nose.    An ongoing (persistent) sore throat or coughing.    Chest pain.    A fever.    Trouble swallowing.     This information is not intended to replace advice given to you by your health care provider. Make sure you discuss any questions you have with your health care provider.     Document Released: 10/28/2004 Document Revised: 07/18/2014 Document Reviewed: 04/16/2014  Elsevier Interactive Patient Education ©2016   Elsevier Inc[employer]

## 2015-11-25 NOTE — Discharge Summary (Signed)
Physician Discharge Summary  Wallace Cullensan F Stoudt RUE:[id number]:[id number] DOB: 12/19/1995 DOA: 11/24/2015  PCP: No PCP Per Patient  Admit date: 11/24/2015 Discharge date: 11/25/2015   Recommendations for Outpatient Follow-Up:   1. Follow-up chest x-ray 11/30/15 at Dr. Barry Dieneswens' office.   Discharge Diagnosis:   Principal Problem:    Pneumomediastinum (HCC) With chest pain   Discharge disposition:  Home.    Discharge Condition: Improved.  Diet recommendation: Low sodium, heart healthy.  History of Present Illness:   20 -year-old male with no significant past medical history who presented to [facility] with worsening chest pain for past 2 days prior to this admission, worse with deep inspiration. Patient had CT angiogram of the chest which revealed pneumomediastinum with tiny pneumothorax and patient was [facility].  Hospital Course by Problem:   Patient was admitted and imaging studies were performed which showed extensive pneumomediastinum extending into inferior cervical region on the right with air dissection along the pulmonary arteries into pulmonary hila and central portions of both lungs. CVTS was consulted. An esophagram was done to rule out esophageal rupture and was negative. He was subsequently cleared for discharge by Dr. Cornelius Moraswen on 11/25/15. He recommended the patient come to his office on 11/30/15 for a follow-up chest x-ray with restrictions on driving, lifting, flying in an airplane and physical activity.   Medical Consultants:    Dr. Cornelius Moraswen CVTS   Discharge Exam:   Filed Vitals:   11/25/15 0500 11/25/15 1314  BP: 109/50 105/57  Pulse:  84  Temp: 97.6 F (36.4 C) 97 F (36.1 C)  Resp: 18 18   Filed Vitals:   11/24/15 0457 11/24/15 2119 11/25/15 0500 11/25/15 1314  BP: 135/64 116/57 109/50 105/57  Pulse: 76 67  84  Temp: 98.5 F (36.9 C) 97.7 F (36.5 C) 97.6 F (36.4 C) 97 F (36.1 C)  TempSrc: Oral  Oral Oral Oral  Resp: 18 18 18 18   Height:      Weight:   79.153 kg (174 lb 8 oz)   SpO2: 100% 98% 100% 98%    Gen:  NAD Cardiovascular:  RRR, No M/R/G Respiratory: Lungs CTAB Gastrointestinal: Abdomen soft, NT/ND with normal active bowel sounds. Extremities: No C/E/C   The results of significant diagnostics from this hospitalization (including imaging, microbiology, ancillary and laboratory) are listed below for reference.     Procedures and Diagnostic Studies:   Dg Chest 2 View  11/25/2015  CLINICAL DATA:  Pneumomediastinum EXAM: CHEST  2 VIEW COMPARISON:  11/23/2015 FINDINGS: Stable pneumomediastinum. Stable subcutaneous emphysema in the supraclavicular regions. No pneumothorax. Normal heart size. No consolidation. IMPRESSION: Stable pneumomediastinum without pneumothorax. Electronically Signed   By: Jolaine ClickArthur  Hoss M.D.   On: 11/25/2015 08:12   Dg Chest 2 View  11/24/2015  CLINICAL DATA:  Upper to mid chest pain. EXAM: CHEST  2 VIEW COMPARISON:  11/23/2015 FINDINGS: Normal heart size. Pneumomediastinum is again identified. Subcutaneous gas is identified within the soft tissues overlying the right lower supraclavicular region. The scratch set there is no pleural effusion or edema. No pneumothorax identified. IMPRESSION: 1. No change in subcutaneous gas and pneumomediastinum. Electronically Signed   By: Signa Kellaylor  Stroud M.D.   On: 11/24/2015 13:51   Dg Esophagus W/water Sol Cm  11/24/2015  CLINICAL DATA:  20 year old male with history of chest pain and spontaneous pneumomediastinum. EXAM: ESOPHOGRAM/BARIUM SWALLOW TECHNIQUE: Single contrast examination was performed using  water soluble. FLUOROSCOPY TIME:  Fluoroscopy Time:  1 minutes and 36 seconds Number of Acquired Images: Multiple series each with multiple images. COMPARISON:  None. FINDINGS: The patient was repeatedly image in the upright, supine and prone positions while swallowing Gastrografin, and at no point during the examination was  extravasation of contrast from the esophageal lumen observed. The esophagus with structurally normal in appearance. IMPRESSION: 1. No evidence of esophageal perforation. Electronically Signed   By: Trudie Reed M.D.   On: 11/24/2015 12:55     Labs:   Basic Metabolic Panel:  Recent Labs Lab 11/23/15 1654 11/24/15 0350  NA 137 141  K 3.2* 4.0  CL 103 104  CO2 26 28  GLUCOSE 100* 88  BUN 6 <5*  CREATININE 1.01 1.06  CALCIUM 10.2 9.8   GFR Estimated Creatinine Clearance: 124.5 mL/min (by C-G formula based on Cr of 1.06). Liver Function Tests:  Recent Labs Lab 11/24/15 0350  AST 16  ALT 19  ALKPHOS 48  BILITOT 1.8*  PROT 6.6  ALBUMIN 4.1    CBC:  Recent Labs Lab 11/23/15 1654 11/24/15 0350  WBC 11.0* 8.1  NEUTROABS  --  5.1  HGB 15.7 14.6  HCT 46.1 42.7  MCV 88.2 88.8  PLT 245 223   Cardiac Enzymes:  Recent Labs Lab 11/23/15 1654 11/24/15 0350  TROPONINI <0.03 <0.03   BNP: Invalid input(s): POCBNP CBG:  Recent Labs Lab 11/24/15 1649 11/24/15 2117 11/25/15 0736 11/25/15 1132  GLUCAP 72 79 81 82     Discharge Instructions:   Discharge Instructions    Call MD for:  persistant dizziness or light-headedness    Complete by:  As directed      Call MD for:  severe uncontrolled pain    Complete by:  As directed      Diet - low sodium heart healthy    Complete by:  As directed      Discharge instructions    Complete by:  As directed   NO lifting, straining, strenuous exertion, or driving a car for at least 2 weeks. Return to Dr. Orvan July office for a chest X-ray on Monday 11/30/15.     Driving Restrictions    Complete by:  As directed   2 weeks     Increase activity slowly    Complete by:  As directed      Lifting restrictions    Complete by:  As directed   2 weeks            Medication List    TAKE these medications        Hydrocodone-Acetaminophen 5-300 MG Tabs  Commonly known as:  VICODIN  Take 1 tablet by mouth every 4 (four)  hours as needed.           Follow-up Information    Follow up with Purcell Nails, MD. Go on 11/30/2015.   Specialty:  Cardiothoracic Surgery   Why:  Repeat chest xray.   Contact information:   [address] [employer] [address] [place] [address] [phone]        Time coordinating discharge: 25 minutes.  Signed:  Corie Allis  Pager [phone] [facility] 11/25/2015, 4:10 PM

## 2017-10-26 IMAGING — DX DG CHEST 2V
2 series · 2 of 2 positions shown · non-contrast
Comparison: 11/23/2015

CLINICAL DATA: Pneumomediastinum

EXAM:
CHEST  2 VIEW

[w chest pa (1 of 2)]
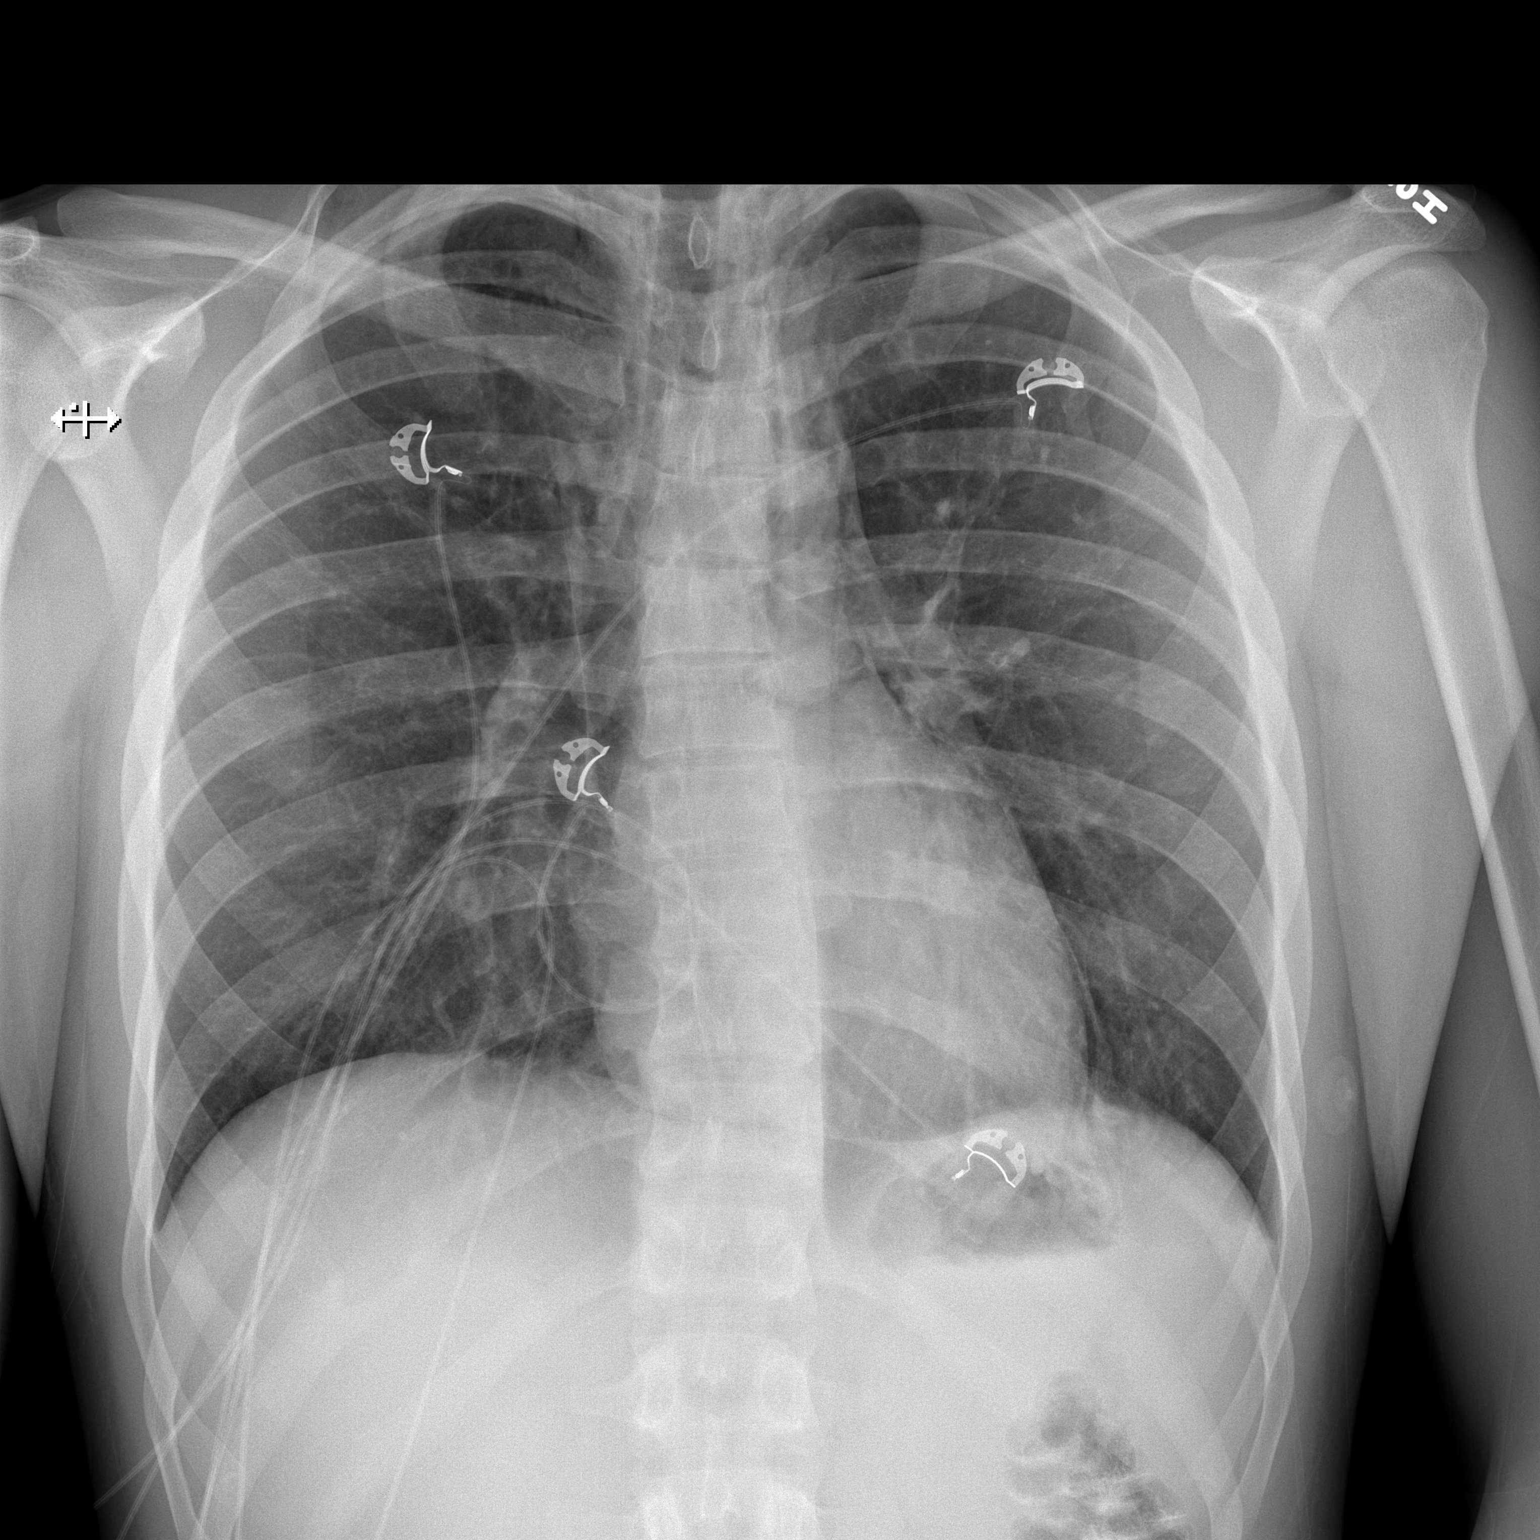

[w chest pa (2 of 2)]
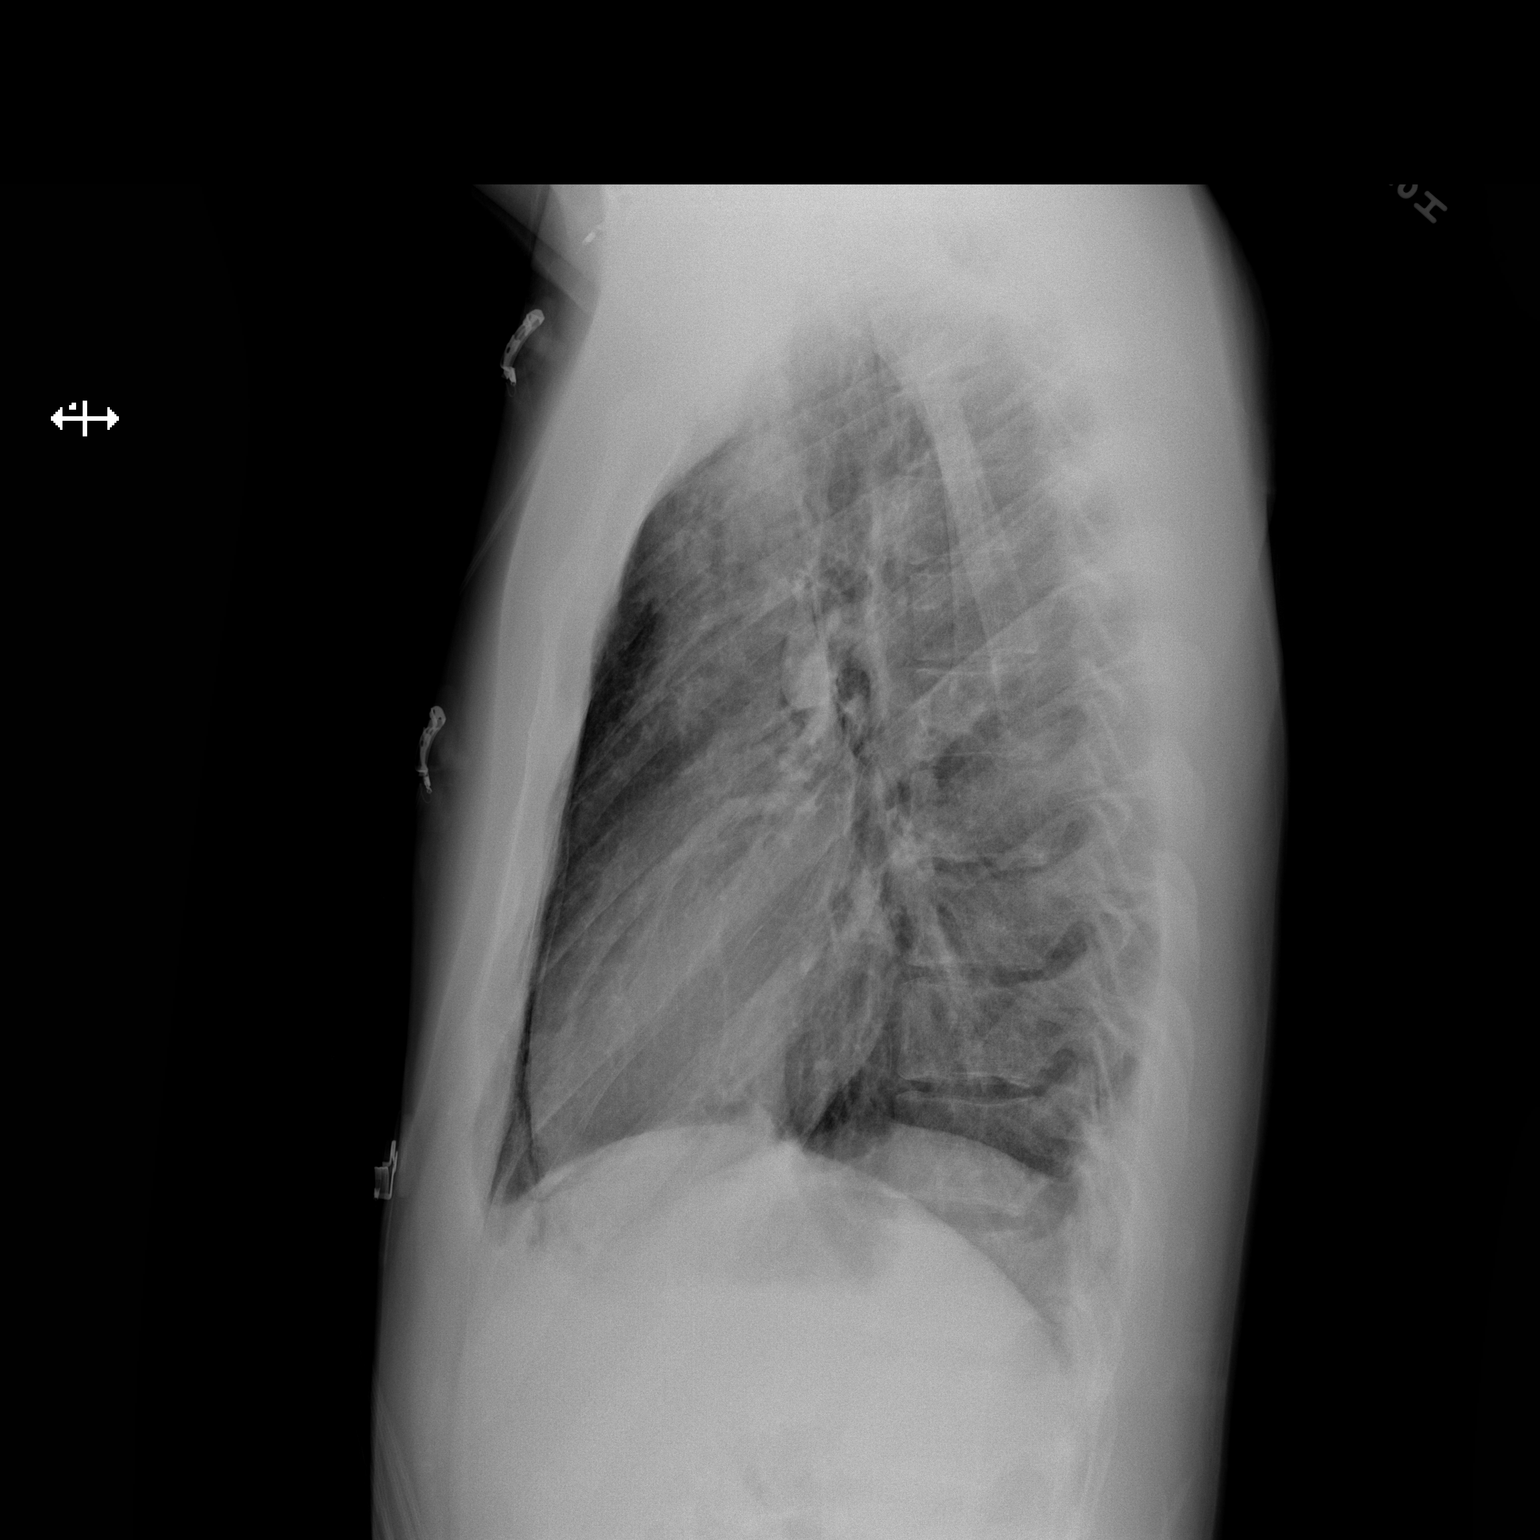

[2 of 2 positions shown; findings below may reference images not displayed]

FINDINGS: Stable pneumomediastinum. Stable subcutaneous emphysema in the
supraclavicular regions. No pneumothorax. Normal heart size. No
consolidation.
IMPRESSION: Stable pneumomediastinum without pneumothorax.
